# Patient Record
Sex: Female | Born: 1995 | Hispanic: Yes | Marital: Married | Smoking: Never smoker
Health system: Southern US, Community
[De-identification: ages and names within clinical notes are randomized; demographics above are authoritative.]

## PROBLEM LIST (undated history)

## (undated) HISTORY — PX: INDUCED ABORTION: SHX677

---
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
## 2015-07-07 ENCOUNTER — Encounter: Payer: Self-pay | Admitting: Emergency Medicine

## 2015-07-07 ENCOUNTER — Emergency Department
Admission: EM | Admit: 2015-07-07 | Discharge: 2015-07-07 | Disposition: A | Discharge status: Home / Self Care | Payer: Self-pay | Attending: Emergency Medicine | Admitting: Emergency Medicine

## 2015-07-07 ENCOUNTER — Emergency Department: Payer: Self-pay

## 2015-07-07 DIAGNOSIS — J36 Peritonsillar abscess: Secondary | ICD-10-CM

## 2015-07-07 DIAGNOSIS — J329 Chronic sinusitis, unspecified: Secondary | ICD-10-CM | POA: Insufficient documentation

## 2015-07-07 DIAGNOSIS — J039 Acute tonsillitis, unspecified: Secondary | ICD-10-CM

## 2015-07-07 DIAGNOSIS — Z3202 Encounter for pregnancy test, result negative: Secondary | ICD-10-CM | POA: Insufficient documentation

## 2015-07-07 LAB — CBC WITH DIFFERENTIAL/PLATELET
Basophils Absolute: 0.1 10*3/uL (ref 0–0.1)
Basophils Relative: 0 %
EOS ABS: 0.2 10*3/uL (ref 0–0.7)
EOS PCT: 1 %
HCT: 32.7 % — ABNORMAL LOW (ref 35.0–47.0)
HEMOGLOBIN: 10.4 g/dL — AB (ref 12.0–16.0)
Lymphocytes Relative: 11 %
Lymphs Abs: 1.9 10*3/uL (ref 1.0–3.6)
MCH: 26.5 pg (ref 26.0–34.0)
MCHC: 31.8 g/dL — ABNORMAL LOW (ref 32.0–36.0)
MCV: 83.4 fL (ref 80.0–100.0)
MONOS PCT: 9 %
Monocytes Absolute: 1.6 10*3/uL — ABNORMAL HIGH (ref 0.2–0.9)
NEUTROS ABS: 13.6 10*3/uL — AB (ref 1.4–6.5)
NEUTROS PCT: 79 %
PLATELETS: 391 10*3/uL (ref 150–440)
RBC: 3.92 MIL/uL (ref 3.80–5.20)
RDW: 15.9 % — ABNORMAL HIGH (ref 11.5–14.5)
WBC: 17.2 10*3/uL — ABNORMAL HIGH (ref 3.6–11.0)

## 2015-07-07 LAB — BASIC METABOLIC PANEL
Anion gap: 8 (ref 5–15)
BUN: 8 mg/dL (ref 6–20)
CHLORIDE: 102 mmol/L (ref 101–111)
CO2: 28 mmol/L (ref 22–32)
Calcium: 9 mg/dL (ref 8.9–10.3)
Creatinine, Ser: 0.65 mg/dL (ref 0.44–1.00)
GFR calc Af Amer: >60 mL/min (ref >60)
GFR calc non Af Amer: >60 mL/min (ref >60)
Glucose, Bld: 113 mg/dL — ABNORMAL HIGH (ref 65–99)
Potassium: 4 mmol/L (ref 3.5–5.1)
SODIUM: 138 mmol/L (ref 135–145)

## 2015-07-07 LAB — URINALYSIS COMPLETE WITH MICROSCOPIC (ARMC ONLY)
Bilirubin Urine: NEGATIVE
Glucose, UA: NEGATIVE mg/dL
Ketones, ur: NEGATIVE mg/dL
NITRITE: NEGATIVE
Protein, ur: 30 mg/dL — AB
Specific Gravity, Urine: 1.02 (ref 1.005–1.030)
pH: 5 (ref 5.0–8.0)

## 2015-07-07 LAB — POCT PREGNANCY, URINE: Preg Test, Ur: NEGATIVE

## 2015-07-07 MED ORDER — DEXAMETHASONE SODIUM PHOSPHATE 10 MG/ML IJ SOLN
10.0000 mg | Freq: Once | INTRAMUSCULAR | Status: AC
  Administered 2015-07-07: 10 mg via INTRAVENOUS
  Filled 2015-07-07: qty 1

## 2015-07-07 MED ORDER — IOHEXOL 300 MG/ML  SOLN
75.0000 mL | Freq: Once | INTRAMUSCULAR | Status: AC | PRN
  Administered 2015-07-07: 75 mL via INTRAVENOUS
  Filled 2015-07-07: qty 75

## 2015-07-07 MED ORDER — SODIUM CHLORIDE 0.9 % IV SOLN
3.0000 g | Freq: Once | INTRAVENOUS | Status: AC
  Administered 2015-07-07: 3 g via INTRAVENOUS
  Filled 2015-07-07: qty 3

## 2015-07-07 MED ORDER — AMOXICILLIN-POT CLAVULANATE 875-125 MG PO TABS: 1.0000 | ORAL_TABLET | Freq: Two times a day (BID) | ORAL | Status: AC

## 2015-07-07 MED ORDER — SODIUM CHLORIDE 0.9 % IV BOLUS (SEPSIS)
1000.0000 mL | Freq: Once | INTRAVENOUS | Status: AC
  Administered 2015-07-07: 1000 mL via INTRAVENOUS

## 2015-07-07 MED ORDER — HYDROCODONE-ACETAMINOPHEN 5-325 MG PO TABS: 1.0000 | ORAL_TABLET | ORAL | Status: AC | PRN

## 2015-07-07 NOTE — ED Notes (Signed)
Pt presents to ER alert and in NAD. Pt states sore throat for 2 weeks, pt states pain with swallowing.

## 2015-07-07 NOTE — ED Notes (Signed)
Pt alert and oriented X4, active, cooperative, pt in NAD. RR even and unlabored, color WNL.  Pt informed to return if any life threatening symptoms occur.   

## 2015-07-07 NOTE — Discharge Instructions (Signed)
RETURN TO ER IF ANY SEVERE WORSENING OF YOUR SYMPTOMS OR URGENT CONCERNS.  RETURN TO ER IF UNABLE TO TAKE MEDICATION, DIFFICULTY SWALLOWING  TAKE AUGMENTIN FOR INFECTION NORCO FOR PAIN AS NEEDED DRINK LOTS OF FLUIDS

## 2015-07-07 NOTE — ED Provider Notes (Signed)
[facility] Emergency Department Provider Note  ____________________________________________  Time seen: 7:22 AM  Chief Complaint Sore Throat   HPI Margaret Koch is a 18 y.o. female here with complaint of sore throat for 2 weeks. Patient states it hurts to swallow but she is able to swallow her own saliva and has been drinking fluids. She is taking over-the-counter medication for fever and pain with little relief.She has not seen anyone prior to this visit. She rates her pain is 7 out of 10 and is constant.   History reviewed. No pertinent past medical history.  There are no active problems to display for this patient.   Past Surgical History  Procedure Laterality Date  . Induced abortion      Current Outpatient Rx  Name  Route  Sig  Dispense  Refill  . amoxicillin-clavulanate (AUGMENTIN) 875-125 MG per tablet   Oral   Take 1 tablet by mouth 2 (two) times daily.   20 tablet   0   . HYDROcodone-acetaminophen (NORCO/VICODIN) 5-325 MG per tablet   Oral   Take 1 tablet by mouth every 4 (four) hours as needed for moderate pain.   20 tablet   0     Allergies Review of patient's allergies indicates no known allergies.  History reviewed. No pertinent family history.  Social History History  Substance Use Topics  . Smoking status: Never Smoker   . Smokeless tobacco: Not on file  . Alcohol Use: No    Review of Systems Constitutional: Positive fever/chills Eyes: No visual changes. ENT: Positive sore throat. Cardiovascular: Denies chest pain. Respiratory: Denies shortness of breath. Gastrointestinal: No abdominal pain.  No nausea, no vomiting.  No diarrhea.  No constipation. Genitourinary: Negative for dysuria. Musculoskeletal: Negative for back pain. Skin: Negative for rash. Neurological: Negative for headaches 10-point ROS otherwise negative.  ____________________________________________   PHYSICAL EXAM:  VITAL SIGNS: ED Triage  Vitals  Enc Vitals Group     BP 07/07/15 0304 126/75 mmHg     Pulse Rate 07/07/15 0304 103     Resp 07/07/15 0304 20     Temp 07/07/15 0304 99 F (37.2 C)     Temp Source 07/07/15 0304 Oral     SpO2 07/07/15 0304 100 %     Weight 07/07/15 0304 123 lb (55.792 kg)     Height 07/07/15 0304 5\' 3"  (1.6 m)     Head Cir --      Peak Flow --      Pain Score 07/07/15 0304 7     Pain Loc --      Pain Edu? --      Excl. in GC? --     Constitutional: Alert and oriented. Well appearing and in no acute distress. Eyes: Conjunctivae are normal. PERRL. EOMI. Head: Atraumatic. Nose: No congestion/rhinnorhea. Mouth/Throat: Mucous membranes are moist.  Oropharynx moderate erythema without exudate. There is some swelling on the right side without any uvula shifting. Patient is able to swallow her own saliva without any difficulty. Voice is muffled somewhat but patient is able to speak in complete sentences.  Neck: No stridor.  Supple Hematological/Lymphatic/Immunilogical: Positive bilateral cervical lymphadenopathy. Cardiovascular: Normal rate, regular rhythm. Grossly normal heart sounds.  Good peripheral circulation. Respiratory: Normal respiratory effort.  No retractions. Lungs CTAB. Gastrointestinal: Soft and nontender. No distention. Musculoskeletal: No lower extremity tenderness nor edema.  No joint effusions. Neurologic:  Normal speech and language. No gross focal neurologic deficits are appreciated. No gait instability. Skin:  Skin  is warm, dry and intact. No rash noted. Psychiatric: Mood and affect are normal. Speech and behavior are normal.  ____________________________________________   LABS (all labs ordered are listed, but only abnormal results are displayed)  Labs Reviewed  CBC WITH DIFFERENTIAL/PLATELET - Abnormal; Notable for the following:    WBC 17.2 (*)    Hemoglobin 10.4 (*)    HCT 32.7 (*)    MCHC 31.8 (*)    RDW 15.9 (*)    Neutro Abs 13.6 (*)    Monocytes Absolute 1.6  (*)    All other components within normal limits  BASIC METABOLIC PANEL - Abnormal; Notable for the following:    Glucose, Bld 113 (*)    All other components within normal limits  URINALYSIS COMPLETEWITH MICROSCOPIC ([facility] ONLY) - Abnormal; Notable for the following:    Color, Urine YELLOW (*)    APPearance CLEAR (*)    Hgb urine dipstick 1+ (*)    Protein, ur 30 (*)    Leukocytes, UA 3+ (*)    Bacteria, UA RARE (*)    Squamous Epithelial / LPF 0-5 (*)    All other components within normal limits  CULTURE, GROUP A STREP ([facility] ONLY)  POC URINE PREG, ED  POCT PREGNANCY, URINE   _RADIOLOGY   CT per radiologist shows tonsillitis with a peritonsillar abscess on the right ____________________________________________   PROCEDURES  Procedure(s) performed: None  Critical Care performed: No  ____________________________________________   INITIAL IMPRESSION / ASSESSMENT AND PLAN / ED COURSE  Pertinent labs & imaging results that were available during my care of the patient were reviewed by me and considered in my medical decision making (see chart for details).  Patient was started on Unasyn 3 g IV in the emergency room and given Decadron IV. She was able to continue drinking fluids in the emergency room and at one point requested some food. She is to continue iron Augmentin for 10 days and prednisone for the next 3 days. She is to follow-up with Dr. Elenore Rota on Monday if not any improvement. She was told to return to the emergency room if any severe worsening of her symptoms such as high fever, inability to swallow her medication or swallow her saliva. ____________________________________________   FINAL CLINICAL IMPRESSION(S) / ED DIAGNOSES  Final diagnoses:  Acute tonsillitis  Peritonsillar abscess      Tommi Rumps, PA-C 07/07/15 1446  Arnaldo Natal, MD 07/07/15 [phone]

## 2015-07-07 NOTE — ED Notes (Addendum)
Pt strep test negative, PA Bjorn Loser) notified

## 2015-07-09 LAB — CULTURE, GROUP A STREP (THRC)

## 2015-10-04 ENCOUNTER — Encounter: Payer: Self-pay | Admitting: Emergency Medicine

## 2015-10-04 ENCOUNTER — Emergency Department
Admission: EM | Admit: 2015-10-04 | Discharge: 2015-10-04 | Disposition: A | Discharge status: Home / Self Care | Attending: Emergency Medicine | Admitting: Emergency Medicine

## 2015-10-04 ENCOUNTER — Encounter: Payer: Self-pay | Admitting: Medical Oncology

## 2015-10-04 ENCOUNTER — Emergency Department
Admission: EM | Admit: 2015-10-04 | Discharge: 2015-10-05 | Disposition: A | Discharge status: Home / Self Care | Source: Home / Self Care | Attending: Emergency Medicine | Admitting: Emergency Medicine

## 2015-10-04 DIAGNOSIS — O9A211 Injury, poisoning and certain other consequences of external causes complicating pregnancy, first trimester: Secondary | ICD-10-CM

## 2015-10-04 DIAGNOSIS — Z3491 Encounter for supervision of normal pregnancy, unspecified, first trimester: Secondary | ICD-10-CM

## 2015-10-04 DIAGNOSIS — Y9389 Activity, other specified: Secondary | ICD-10-CM | POA: Insufficient documentation

## 2015-10-04 DIAGNOSIS — Y9289 Other specified places as the place of occurrence of the external cause: Secondary | ICD-10-CM

## 2015-10-04 DIAGNOSIS — Z3A10 10 weeks gestation of pregnancy: Secondary | ICD-10-CM

## 2015-10-04 DIAGNOSIS — Y990 Civilian activity done for income or pay: Secondary | ICD-10-CM

## 2015-10-04 DIAGNOSIS — W108XXA Fall (on) (from) other stairs and steps, initial encounter: Secondary | ICD-10-CM | POA: Insufficient documentation

## 2015-10-04 DIAGNOSIS — S3993XA Unspecified injury of pelvis, initial encounter: Secondary | ICD-10-CM | POA: Insufficient documentation

## 2015-10-04 DIAGNOSIS — W19XXXA Unspecified fall, initial encounter: Secondary | ICD-10-CM

## 2015-10-04 DIAGNOSIS — Y998 Other external cause status: Secondary | ICD-10-CM | POA: Insufficient documentation

## 2015-10-04 DIAGNOSIS — S3991XA Unspecified injury of abdomen, initial encounter: Secondary | ICD-10-CM | POA: Insufficient documentation

## 2015-10-04 DIAGNOSIS — O418X10 Other specified disorders of amniotic fluid and membranes, first trimester, not applicable or unspecified: Secondary | ICD-10-CM

## 2015-10-04 DIAGNOSIS — O468X1 Other antepartum hemorrhage, first trimester: Principal | ICD-10-CM

## 2015-10-04 NOTE — ED Notes (Signed)
Pt presents to ED with c/o vaginal bleeding; approx [redacted] weeks pregnant. Pt states she was seen in this ED earlier today for lower abd cramping after she fell down a few stairs while at work. Ultrasound and blood work were normal per pt. Pt states her cramping has continued and approx 20 minutes ago she noticed that she was bleeding slightly and returned for further evaluation. Unsure if she is passing clots. No other complications during this pregnancy. Followed by [facility].

## 2015-10-04 NOTE — Discharge Instructions (Signed)
Please seek medical attention for any high fevers, chest pain, shortness of breath, change in behavior, persistent vomiting, bloody stool or any other new or concerning symptoms.   First Trimester of Pregnancy The first trimester of pregnancy is from week 1 until the end of week 12 (months 1 through 3). A week after a sperm fertilizes an egg, the egg will implant on the wall of the uterus. This embryo will begin to develop into a baby. Genes from you and your partner are forming the baby. The female genes determine whether the baby is a boy or a girl. At 6-8 weeks, the eyes and face are formed, and the heartbeat can be seen on ultrasound. At the end of 12 weeks, all the baby's organs are formed.  Now that you are pregnant, you will want to do everything you can to have a healthy baby. Two of the most important things are to get good prenatal care and to follow your health care provider's instructions. Prenatal care is all the medical care you receive before the baby's birth. This care will help prevent, find, and treat any problems during the pregnancy and childbirth. BODY CHANGES Your body goes through many changes during pregnancy. The changes vary from woman to woman.   You may gain or lose a couple of pounds at first.  You may feel sick to your stomach (nauseous) and throw up (vomit). If the vomiting is uncontrollable, call your health care provider.  You may tire easily.  You may develop headaches that can be relieved by medicines approved by your health care provider.  You may urinate more often. Painful urination may mean you have a bladder infection.  You may develop heartburn as a result of your pregnancy.  You may develop constipation because certain hormones are causing the muscles that push waste through your intestines to slow down.  You may develop hemorrhoids or swollen, bulging veins (varicose veins).  Your breasts may begin to grow larger and become tender. Your nipples may  stick out more, and the tissue that surrounds them (areola) may become darker.  Your gums may bleed and may be sensitive to brushing and flossing.  Dark spots or blotches (chloasma, mask of pregnancy) may develop on your face. This will likely fade after the baby is born.  Your menstrual periods will stop.  You may have a loss of appetite.  You may develop cravings for certain kinds of food.  You may have changes in your emotions from day to day, such as being excited to be pregnant or being concerned that something may go wrong with the pregnancy and baby.  You may have more vivid and strange dreams.  You may have changes in your hair. These can include thickening of your hair, rapid growth, and changes in texture. Some women also have hair loss during or after pregnancy, or hair that feels dry or thin. Your hair will most likely return to normal after your baby is born. WHAT TO EXPECT AT YOUR PRENATAL VISITS During a routine prenatal visit:  You will be weighed to make sure you and the baby are growing normally.  Your blood pressure will be taken.  Your abdomen will be measured to track your baby's growth.  The fetal heartbeat will be listened to starting around week 10 or 12 of your pregnancy.  Test results from any previous visits will be discussed. Your health care provider may ask you:  How you are feeling.  If you are feeling  the baby move. °· If you have had any abnormal symptoms, such as leaking fluid, bleeding, severe headaches, or abdominal cramping. °· If you are using any tobacco products, including cigarettes, chewing tobacco, and electronic cigarettes. °· If you have any questions. °Other tests that may be performed during your first trimester include: °· Blood tests to find your blood type and to check for the presence of any previous infections. They will also be used to check for low iron levels (anemia) and Rh antibodies. Later in the pregnancy, blood tests for  diabetes will be done along with other tests if problems develop. °· Urine tests to check for infections, diabetes, or protein in the urine. °· An ultrasound to confirm the proper growth and development of the baby. °· An amniocentesis to check for possible genetic problems. °· Fetal screens for spina bifida and Down syndrome. °· You may need other tests to make sure you and the baby are doing well. °· HIV (human immunodeficiency virus) testing. Routine prenatal testing includes screening for HIV, unless you choose not to have this test. °HOME CARE INSTRUCTIONS  °Medicines °· Follow your health care provider's instructions regarding medicine use. Specific medicines may be either safe or unsafe to take during pregnancy. °· Take your prenatal vitamins as directed. °· If you develop constipation, try taking a stool softener if your health care provider approves. °Diet °· Eat regular, well-balanced meals. Choose a variety of foods, such as meat or vegetable-based protein, fish, milk and low-fat dairy products, vegetables, fruits, and whole grain breads and cereals. Your health care provider will help you determine the amount of weight gain that is right for you. °· Avoid raw meat and uncooked cheese. These carry germs that can cause birth defects in the baby. °· Eating four or five small meals rather than three large meals a day may help relieve nausea and vomiting. If you start to feel nauseous, eating a few soda crackers can be helpful. Drinking liquids between meals instead of during meals also seems to help nausea and vomiting. °· If you develop constipation, eat more high-fiber foods, such as fresh vegetables or fruit and whole grains. Drink enough fluids to keep your urine clear or pale yellow. °Activity and Exercise °· Exercise only as directed by your health care provider. Exercising will help you: °¨ Control your weight. °¨ Stay in shape. °¨ Be prepared for labor and delivery. °· Experiencing pain or cramping  in the lower abdomen or low back is a good sign that you should stop exercising. Check with your health care provider before continuing normal exercises. °· Try to avoid standing for long periods of time. Move your legs often if you must stand in one place for a long time. °· Avoid heavy lifting. °· Wear low-heeled shoes, and practice good posture. °· You may continue to have sex unless your health care provider directs you otherwise. °Relief of Pain or Discomfort °· Wear a good support bra for breast tenderness.   °· Take warm sitz baths to soothe any pain or discomfort caused by hemorrhoids. Use hemorrhoid cream if your health care provider approves.   °· Rest with your legs elevated if you have leg cramps or low back pain. °· If you develop varicose veins in your legs, wear support hose. Elevate your feet for 15 minutes, 3-4 times a day. Limit salt in your diet. °Prenatal Care °· Schedule your prenatal visits by the twelfth week of pregnancy. They are usually scheduled monthly at first, then more   often in the last 2 months before delivery.  Write down your questions. Take them to your prenatal visits.  Keep all your prenatal visits as directed by your health care provider. Safety  Wear your seat belt at all times when driving.  Make a list of emergency phone numbers, including numbers for family, friends, the hospital, and police and fire departments. General Tips  Ask your health care provider for a referral to a local prenatal education class. Begin classes no later than at the beginning of month 6 of your pregnancy.  Ask for help if you have counseling or nutritional needs during pregnancy. Your health care provider can offer advice or refer you to specialists for help with various needs.  Do not use hot tubs, steam rooms, or saunas.  Do not douche or use tampons or scented sanitary pads.  Do not cross your legs for long periods of time.  Avoid cat litter boxes and soil used by cats.  These carry germs that can cause birth defects in the baby and possibly loss of the fetus by miscarriage or stillbirth.  Avoid all smoking, herbs, alcohol, and medicines not prescribed by your health care provider. Chemicals in these affect the formation and growth of the baby.  Do not use any tobacco products, including cigarettes, chewing tobacco, and electronic cigarettes. If you need help quitting, ask your health care provider. You may receive counseling support and other resources to help you quit.  Schedule a dentist appointment. At home, brush your teeth with a soft toothbrush and be gentle when you floss. SEEK MEDICAL CARE IF:   You have dizziness.  You have mild pelvic cramps, pelvic pressure, or nagging pain in the abdominal area.  You have persistent nausea, vomiting, or diarrhea.  You have a bad smelling vaginal discharge.  You have pain with urination.  You notice increased swelling in your face, hands, legs, or ankles. SEEK IMMEDIATE MEDICAL CARE IF:   You have a fever.  You are leaking fluid from your vagina.  You have spotting or bleeding from your vagina.  You have severe abdominal cramping or pain.  You have rapid weight gain or loss.  You vomit blood or material that looks like coffee grounds.  You are exposed to [place] measles and have never had them.  You are exposed to fifth disease or chickenpox.  You develop a severe headache.  You have shortness of breath.  You have any kind of trauma, such as from a fall or a car accident.   This information is not intended to replace advice given to you by your health care provider. Make sure you discuss any questions you have with your health care provider.   Document Released: 11/25/2001 Document Revised: 12/22/2014 Document Reviewed: 10/11/2013 Elsevier Interactive Patient Education [employer]2016 [employer] Inc.

## 2015-10-04 NOTE — ED Provider Notes (Signed)
[facility] Emergency Department Provider Note    ____________________________________________  Time seen: 1725  I have reviewed the triage vital signs and the nursing notes.   HISTORY  Chief Complaint Fall and Abdominal Pain   History limited by: Not Limited   HPI Margaret Koch is a 19 y.o. female who presents to the emergency department at roughly [redacted] weeks pregnant after a fall. She states she fell down about 5 or 6 stairs. She landed on her left side however then rolled over to her right side. She has had some pelvic cramping since then. It has been fairly constant since the injury. She denies any vaginal bleeding or abnormal vaginal discharge. She denies any injury to her head or neck.   No past medical history on file.  There are no active problems to display for this patient.   Past Surgical History  Procedure Laterality Date  . Induced abortion      Current Outpatient Rx  Name  Route  Sig  Dispense  Refill  . HYDROcodone-acetaminophen (NORCO/VICODIN) 5-325 MG per tablet   Oral   Take 1 tablet by mouth every 4 (four) hours as needed for moderate pain.   20 tablet   0     Allergies Review of patient's allergies indicates no known allergies.  No family history on file.  Social History Social History  Substance Use Topics  . Smoking status: Never Smoker   . Smokeless tobacco: Not on file  . Alcohol Use: No    Review of Systems  Constitutional: Negative for fever. Cardiovascular: Negative for chest pain. Respiratory: Negative for shortness of breath. Gastrointestinal: Abdominal cramping Genitourinary: Negative for dysuria. Musculoskeletal: Negative for back pain. Skin: Negative for rash. Neurological: Negative for headaches, focal weakness or numbness.   10-point ROS otherwise negative.  ____________________________________________   PHYSICAL EXAM:  VITAL SIGNS: ED Triage Vitals  Enc Vitals Group     BP  10/04/15 1707 142/74 mmHg     Pulse Rate 10/04/15 1707 115     Resp 10/04/15 1707 18     Temp 10/04/15 1707 98.7 F (37.1 C)     Temp Source 10/04/15 1707 Oral     SpO2 10/04/15 1707 99 %     Weight 10/04/15 1707 123 lb (55.792 kg)     Height 10/04/15 1707  (1.6 m)     Head Cir --      Peak Flow --      Pain Score 10/04/15 1707 0   Constitutional: Alert and oriented. Well appearing and in no distress. Eyes: Conjunctivae are normal. PERRL. Normal extraocular movements. ENT   Head: Normocephalic and atraumatic.   Nose: No congestion/rhinnorhea.   Mouth/Throat: Mucous membranes are moist.   Neck: No stridor. Hematological/Lymphatic/Immunilogical: No cervical lymphadenopathy. Cardiovascular: Normal rate, regular rhythm.  No murmurs, rubs, or gallops. Respiratory: Normal respiratory effort without tachypnea nor retractions. Breath sounds are clear and equal bilaterally. No wheezes/rales/rhonchi. Gastrointestinal: Soft and minimally tender to palpation. Genitourinary: Deferred Musculoskeletal: Normal range of motion in all extremities. No joint effusions.  No lower extremity tenderness nor edema. Neurologic:  Normal speech and language. No gross focal neurologic deficits are appreciated. Speech is normal.  Skin:  Skin is warm, dry and intact. No rash noted. Psychiatric: Mood and affect are normal. Speech and behavior are normal. Patient exhibits appropriate insight and judgment.  ____________________________________________    LABS (pertinent positives/negatives)  None  ____________________________________________   EKG  None  ____________________________________________    RADIOLOGY  None   ____________________________________________   PROCEDURES  Procedure(s) performed: None  Critical Care performed: No  ____________________________________________   INITIAL IMPRESSION / ASSESSMENT AND PLAN / ED COURSE  Pertinent labs & imaging results  that were available during my care of the patient were reviewed by me and considered in my medical decision making (see chart for details).  Patient presents to the emergency department with concerns for her pregnancy after a fall. Bedside ultrasound shows an intrauterine pregnancy. Fetal heart rate was measured at 176. Good fetal movement. Large amount of amniotic fluid. Patient without any vaginal bleeding. Minimal tenderness to palpation. No concerning fetal findings at this point. I did explain to the patient the importance of following up with OB/GYN since he can get a more complete ultrasound. Discussed return precautions.  ____________________________________________   FINAL CLINICAL IMPRESSION(S) / ED DIAGNOSES  Final diagnoses:  Fall, initial encounter  First trimester pregnancy     Phineas SemenGraydon Kameron Glazebrook, MD 10/04/15 [phone]

## 2015-10-04 NOTE — ED Notes (Signed)
Pt ambulatory to triage with reports that she is approx 10w preg, has not had ultrasound to confirm but reports that she fell on her stomach today and since then has been having some abd cramping. Denies vaginal bleeding.

## 2015-10-04 NOTE — ED Notes (Signed)
MD at bedside with ultrasound machine

## 2015-10-05 ENCOUNTER — Emergency Department

## 2015-10-05 LAB — CBC
HEMATOCRIT: 32.1 % — AB (ref 35.0–47.0)
HEMOGLOBIN: 10.4 g/dL — AB (ref 12.0–16.0)
MCH: 25.4 pg — AB (ref 26.0–34.0)
MCHC: 32.5 g/dL (ref 32.0–36.0)
MCV: 78.1 fL — ABNORMAL LOW (ref 80.0–100.0)
Platelets: 287 10*3/uL (ref 150–440)
RBC: 4.11 MIL/uL (ref 3.80–5.20)
RDW: 19.2 % — ABNORMAL HIGH (ref 11.5–14.5)
WBC: 10 10*3/uL (ref 3.6–11.0)

## 2015-10-05 LAB — ABO/RH: ABO/RH(D): O POS

## 2015-10-05 LAB — HCG, QUANTITATIVE, PREGNANCY: HCG, BETA CHAIN, QUANT, S: 83008 m[IU]/mL — AB (ref <5)

## 2015-10-05 NOTE — ED Provider Notes (Signed)
[facility] Emergency Department Provider Note  ____________________________________________  Time seen: 11:50 PM  I have reviewed the triage vital signs and the nursing notes.   HISTORY  Chief Complaint Vaginal Bleeding      HPI Margaret Koch is a 19 y.o. female G2P01 elective abortion earlier this year. Presents with history of accidentally falling down approximately 6 or 7 steps while at work today. Patient denies any head injury no loss of consciousness. Patient however does admit to bilateral flank pain worse on the left. Patient was seen in the emergency department earlier today bedside ultrasound was performed by Dr. Derrill Kay who stated no gross abnormality. Patient now returns with heavy vaginal bleeding noted approximately one hour before presentation. Patient also admits to pelvic cramping current pain score 7 out of 10. Patient does not know her blood type.   Past medical history None There are no active problems to display for this patient.   Past Surgical History  Procedure Laterality Date  . Induced abortion      Current Outpatient Rx  Name  Route  Sig  Dispense  Refill  . HYDROcodone-acetaminophen (NORCO/VICODIN) 5-325 MG per tablet   Oral   Take 1 tablet by mouth every 4 (four) hours as needed for moderate pain.   20 tablet   0     Allergies No known drug allergies No family history on file.  Social History Social History  Substance Use Topics  . Smoking status: Never Smoker   . Smokeless tobacco: None  . Alcohol Use: No    Review of Systems  Constitutional: Negative for fever. Eyes: Negative for visual changes. ENT: Negative for sore throat. Cardiovascular: Negative for chest pain. Respiratory: Negative for shortness of breath. Gastrointestinal: Positive for abdominal pain, Positive for pelvic pain Genitourinary: Negative for dysuria. Musculoskeletal: Negative for back pain. Skin: Negative for  rash. Neurological: Negative for headaches, focal weakness or numbness.   10-point ROS otherwise negative.  ____________________________________________   PHYSICAL EXAM:  VITAL SIGNS: ED Triage Vitals  Enc Vitals Group     BP 10/04/15 2349 138/82 mmHg     Pulse Rate 10/04/15 2349 78     Resp 10/04/15 2349 18     Temp 10/04/15 2349 98.3 F (36.8 C)     Temp Source 10/04/15 2349 Oral     SpO2 10/04/15 2349 98 %     Weight 10/04/15 2349 123 lb (55.792 kg)     Height 10/04/15 2349  (1.6 m)     Head Cir --      Peak Flow --      Pain Score 10/04/15 2350 0     Pain Loc --      Pain Edu? --      Excl. in GC? --     Constitutional: Alert and oriented. Well appearing and in no distress. Eyes: Conjunctivae are normal. PERRL. Normal extraocular movements. ENT   Head: Normocephalic and atraumatic.   Nose: No congestion/rhinnorhea.   Mouth/Throat: Mucous membranes are moist.   Neck: No stridor. Hematological/Lymphatic/Immunilogical: No cervical lymphadenopathy. Cardiovascular: Normal rate, regular rhythm. Normal and symmetric distal pulses are present in all extremities. No murmurs, rubs, or gallops. Respiratory: Normal respiratory effort without tachypnea nor retractions. Breath sounds are clear and equal bilaterally. No wheezes/rales/rhonchi. Gastrointestinal: Left upper quadrant pain with palpation.. No distention. There is no CVA tenderness. Genitourinary: deferred Musculoskeletal: Nontender with normal range of motion in all extremities. No joint effusions.  No lower extremity tenderness nor edema.  Neurologic:  Normal speech and language. No gross focal neurologic deficits are appreciated. Speech is normal.  Skin:  Skin is warm, dry and intact. No rash noted. Psychiatric: Mood and affect are normal. Speech and behavior are normal. Patient exhibits appropriate insight and judgment.  ____________________________________________    LABS (pertinent  positives/negatives)  Labs Reviewed  HCG, QUANTITATIVE, PREGNANCY - Abnormal; Notable for the following:    hCG, Beta Chain, Quant, S 83008 (*)    All other components within normal limits  CBC - Abnormal; Notable for the following:    Hemoglobin 10.4 (*)    HCT 32.1 (*)    MCV 78.1 (*)    MCH 25.4 (*)    RDW 19.2 (*)    All other components within normal limits  ABO/RH     RADIOLOGY   US Abdomen Limited (Final result) Result time: 10/05/15 01:22:21   Final result by Rad Results In Interface (10/05/15 01:22:21)   Narrative:   CLINICAL DATA: Status post fall, with left upper quadrant abdominal tenderness. Evaluate spleen. Initial encounter.  EXAM: LIMITED ABDOMINAL ULTRASOUND  COMPARISON: None.  FINDINGS: The spleen is unremarkable in appearance, measuring 7.4 cm in length. There is no evidence of splenic injury.  IMPRESSION: Spleen unremarkable in appearance.   Electronically Signed By: Roanna Raider M.D. On: 10/05/2015 01:22          US Ob Transvaginal (Final result) Result time: 10/05/15 01:06:27   Final result by Rad Results In Interface (10/05/15 01:06:27)   Narrative:   CLINICAL DATA: Acute onset of pelvic pain and vaginal bleeding, status post fall.  EXAM: OBSTETRIC <14 WK [place] AND TRANSVAGINAL OB US  TECHNIQUE: Both transabdominal and transvaginal ultrasound examinations were performed for complete evaluation of the gestation as well as the maternal uterus, adnexal regions, and pelvic cul-de-sac. Transvaginal technique was performed to assess early pregnancy.  COMPARISON: None.  FINDINGS: Intrauterine gestational sac: Visualized/normal in shape.  Yolk sac: Yes  Embryo: Yes  Cardiac Activity: Yes  Heart Rate: 168 bpm  CRL: 3.22 cm  10 w  1 d         [place] EDC: 05/01/2016  Maternal uterus/adnexae: A small amount of subchorionic hemorrhage is noted. The uterus is otherwise unremarkable in appearance.  The  right ovary is unremarkable in appearance, measuring 3.0 x 1.4 x 2.0 cm. The left ovary is not visualized on this study. No suspicious adnexal masses are seen. There is no evidence for ovarian torsion.  No free fluid is seen within the pelvic cul-de-sac.  IMPRESSION: 1. Single live intrauterine pregnancy noted, with a crown-rump length of 3.2 cm, corresponding to a gestational age of [redacted] weeks 1 day. This matches the gestational age of [redacted] weeks 3 days by LMP, reflecting an estimated date of delivery of May 06, 2016. 2. Small amount of subchorionic hemorrhage noted.   Electronically Signed By: Roanna Raider M.D. On: 10/05/2015 01:06          US OB Comp Less 14 Wks (Final result) Result time: 10/05/15 01:06:27   Final result by Rad Results In Interface (10/05/15 01:06:27)   Narrative:   CLINICAL DATA: Acute onset of pelvic pain and vaginal bleeding, status post fall.  EXAM: OBSTETRIC <14 WK [place] AND TRANSVAGINAL OB US  TECHNIQUE: Both transabdominal and transvaginal ultrasound examinations were performed for complete evaluation of the gestation as well as the maternal uterus, adnexal regions, and pelvic cul-de-sac. Transvaginal technique was performed to assess early pregnancy.  COMPARISON: None.  FINDINGS: Intrauterine gestational  sac: Visualized/normal in shape.  Yolk sac: Yes  Embryo: Yes  Cardiac Activity: Yes  Heart Rate: 168 bpm  CRL: 3.22 cm  10 w  1 d         [place] EDC: 05/01/2016  Maternal uterus/adnexae: A small amount of subchorionic hemorrhage is noted. The uterus is otherwise unremarkable in appearance.  The right ovary is unremarkable in appearance, measuring 3.0 x 1.4 x 2.0 cm. The left ovary is not visualized on this study. No suspicious adnexal masses are seen. There is no evidence for ovarian torsion.  No free fluid is seen within the pelvic cul-de-sac.  IMPRESSION: 1. Single live intrauterine pregnancy noted, with a  crown-rump length of 3.2 cm, corresponding to a gestational age of [redacted] weeks 1 day. This matches the gestational age of [redacted] weeks 3 days by LMP, reflecting an estimated date of delivery of May 06, 2016. 2. Small amount of subchorionic hemorrhage noted.   Electronically Signed By: Roanna RaiderJeffery Chang M.D. On: 10/05/2015 01:06       INITIAL IMPRESSION / ASSESSMENT AND PLAN / ED COURSE  Pertinent labs & imaging results that were available during my care of the patient were reviewed by me and considered in my medical decision making (see chart for details).  History of physical exam consistent with traumatically induced subchorionic hematoma. Patient's blood type is O+ I advised the patient and her husband at length regarding need for follow-up.  ____________________________________________   FINAL CLINICAL IMPRESSION(S) / ED DIAGNOSES  Final diagnoses:  Subchorionic hemorrhage, first trimester      Darci Currentandolph N Brown, MD 10/05/15 [phone]

## 2015-10-05 NOTE — Discharge Instructions (Signed)
Subchorionic Hematoma °A subchorionic hematoma is a gathering of blood between the outer wall of the placenta and the inner wall of the womb (uterus). The placenta is the organ that connects the fetus to the wall of the uterus. The placenta performs the feeding, breathing (oxygen to the fetus), and waste removal (excretory work) of the fetus.  °Subchorionic hematoma is the most common abnormality found on a result from ultrasonography done during the first trimester or early second trimester of pregnancy. If there has been little or no vaginal bleeding, early small hematomas usually shrink on their own and do not affect your baby or pregnancy. The blood is gradually absorbed over 1-2 weeks. When bleeding starts later in pregnancy or the hematoma is larger or occurs in an older pregnant woman, the outcome may not be as good. Larger hematomas may get bigger, which increases the chances for miscarriage. Subchorionic hematoma also increases the risk of premature detachment of the placenta from the uterus, preterm (premature) labor, and stillbirth. °HOME CARE INSTRUCTIONS °· Stay on bed rest if your health care provider recommends this. Although bed rest will not prevent more bleeding or prevent a miscarriage, your health care provider may recommend bed rest until you are advised otherwise. °· Avoid heavy lifting (more than 10 lb [4.5 kg]), exercise, sexual intercourse, or douching as directed by your health care provider. °· Keep track of the number of pads you use each day and how soaked (saturated) they are. Write down this information. °· Do not use tampons. °· Keep all follow-up appointments as directed by your health care provider. Your health care provider may ask you to have follow-up blood tests or ultrasound tests or both. °SEEK IMMEDIATE MEDICAL CARE IF: °· You have severe cramps in your stomach, back, abdomen, or pelvis. °· You have a fever. °· You pass large clots or tissue. Save any tissue for your health  care provider to look at. °· Your bleeding increases or you become lightheaded, feel weak, or have fainting episodes. °  °This information is not intended to replace advice given to you by your health care provider. Make sure you discuss any questions you have with your health care provider. °  °Document Released: 03/18/2007 Document Revised: 12/22/2014 Document Reviewed: 06/30/2013 °Elsevier Interactive Patient Education ©2016 Elsevier Inc. ° °

## 2015-10-06 ENCOUNTER — Emergency Department
Admission: EM | Admit: 2015-10-06 | Discharge: 2015-10-06 | Disposition: A | Discharge status: Home / Self Care | Source: Home / Self Care | Attending: Emergency Medicine | Admitting: Emergency Medicine

## 2015-10-06 ENCOUNTER — Emergency Department

## 2015-10-06 DIAGNOSIS — O418X10 Other specified disorders of amniotic fluid and membranes, first trimester, not applicable or unspecified: Secondary | ICD-10-CM | POA: Insufficient documentation

## 2015-10-06 DIAGNOSIS — O468X1 Other antepartum hemorrhage, first trimester: Secondary | ICD-10-CM

## 2015-10-06 DIAGNOSIS — O209 Hemorrhage in early pregnancy, unspecified: Secondary | ICD-10-CM

## 2015-10-06 DIAGNOSIS — Z3A11 11 weeks gestation of pregnancy: Secondary | ICD-10-CM

## 2015-10-06 LAB — HCG, QUANTITATIVE, PREGNANCY: hCG, Beta Chain, Quant, S: 91493 m[IU]/mL — ABNORMAL HIGH (ref <5)

## 2015-10-06 NOTE — ED Notes (Signed)
Reports seen in ED earlier in the week after a fall and again had vaginal bleeding.  Patient reports being [redacted] week pregnant and that bleeding has gotten worse.

## 2015-10-06 NOTE — Discharge Instructions (Signed)
Your ultrasound this evening showed a clearing up some of the blood that had built up from the subchorionic hemorrhage.  Do not have sex or place anything in the vagina until you are [facility] as you have planned. Return to the emergency department if you have worsening pain heavier bleeding or other urgent concerns.  Vaginal Bleeding During Pregnancy, First Trimester A small amount of bleeding (spotting) from the vagina is relatively common in early pregnancy. It usually stops on its own. Various things may cause bleeding or spotting in early pregnancy. Some bleeding may be related to the pregnancy, and some may not. In most cases, the bleeding is normal and is not a problem. However, bleeding can also be a sign of something serious. Be sure to tell your health care provider about any vaginal bleeding right away. Some possible causes of vaginal bleeding during the first trimester include:  Infection or inflammation of the cervix.  Growths (polyps) on the cervix.  Miscarriage or threatened miscarriage.  Pregnancy tissue has developed outside of the uterus and in a fallopian tube (tubal pregnancy).  Tiny cysts have developed in the uterus instead of pregnancy tissue (molar pregnancy). HOME CARE INSTRUCTIONS  Watch your condition for any changes. The following actions may help to lessen any discomfort you are feeling:  Follow your health care provider's instructions for limiting your activity. If your health care provider orders bed rest, you may need to stay in bed and only get up to use the bathroom. However, your health care provider may allow you to continue light activity.  If needed, make plans for someone to help with your regular activities and responsibilities while you are on bed rest.  Keep track of the number of pads you use each day, how often you change pads, and how soaked (saturated) they are. Write this down.  Do not use tampons. Do not douche.  Do not have  sexual intercourse or orgasms until approved by your health care provider.  If you pass any tissue from your vagina, save the tissue so you can show it to your health care provider.  Only take over-the-counter or prescription medicines as directed by your health care provider.  Do not take aspirin because it can make you bleed.  Keep all follow-up appointments as directed by your health care provider. SEEK MEDICAL CARE IF:  You have any vaginal bleeding during any part of your pregnancy.  You have cramps or labor pains.  You have a fever, not controlled by medicine. SEEK IMMEDIATE MEDICAL CARE IF:   You have severe cramps in your back or belly (abdomen).  You pass large clots or tissue from your vagina.  Your bleeding increases.  You feel light-headed or weak, or you have fainting episodes.  You have chills.  You are leaking fluid or have a gush of fluid from your vagina.  You pass out while having a bowel movement. MAKE SURE YOU:  Understand these instructions.  Will watch your condition.  Will get help right away if you are not doing well or get worse.   This information is not intended to replace advice given to you by your health care provider. Make sure you discuss any questions you have with your health care provider.   Document Released: 09/10/2005 Document Revised: 12/06/2013 Document Reviewed: 08/08/2013 Elsevier Interactive Patient Education [employer]2016 Elsevier Inc.

## 2015-10-06 NOTE — ED Provider Notes (Signed)
[facility] Emergency Department Provider Note  ____________________________________________  Time seen: 2120  I have reviewed the triage vital signs and the nursing notes.   HISTORY  Chief Complaint Vaginal Bleeding     HPI Margaret Koch is a 19 y.o. female who presents the emergency department due to vaginal bleeding. She was recently diagnosed pregnant. She had some vaginal bleeding this past Thursday and was seen in the emergency department. Her ultrasound showed that she was approximately [redacted] weeks along. That evening, she had intercourse with her significant other. Following this she had further vaginal bleeding which worsened on Friday and continued today. She reports that she simply had light spotting on Thursday, but yesterday and today it is been more like her menstrual period. She is having some cramping.    No past medical history on file.  There are no active problems to display for this patient.   Past Surgical History  Procedure Laterality Date  . Induced abortion      Current Outpatient Rx  Name  Route  Sig  Dispense  Refill  . HYDROcodone-acetaminophen (NORCO/VICODIN) 5-325 MG per tablet   Oral   Take 1 tablet by mouth every 4 (four) hours as needed for moderate pain.   20 tablet   0     Allergies Review of patient's allergies indicates no known allergies.  No family history on file.  Social History Social History  Substance Use Topics  . Smoking status: Never Smoker   . Smokeless tobacco: Not on file  . Alcohol Use: No    Review of Systems Constitutional: Negative for fever. ENT: Negative for sore throat. Cardiovascular: Negative for chest pain. Respiratory: Negative for cough. Gastrointestinal: Negative for abdominal pain, vomiting and diarrhea. Genitourinary: Vaginal bleeding and pelvic cramping. Patient is [redacted] weeks pregnant. See history of present illness. Musculoskeletal: No myalgias or injuries. Skin:  Negative for rash. Neurological: Negative for paresthesia or weakness   10-point ROS otherwise negative.  ____________________________________________   PHYSICAL EXAM:  VITAL SIGNS: ED Triage Vitals  Enc Vitals Group     BP 10/06/15 1921 137/78 mmHg     Pulse Rate 10/06/15 1921 75     Resp 10/06/15 1921 18     Temp 10/06/15 1921 98.2 F (36.8 C)     Temp Source 10/06/15 1921 Oral     SpO2 10/06/15 1921 100 %     Weight 10/06/15 1921 123 lb (55.792 kg)     Height 10/06/15 1921  (1.6 m)     Head Cir --      Peak Flow --      Pain Score 10/06/15 1921 0     Pain Loc --      Pain Edu? --      Excl. in GC? --     Constitutional: Alert and oriented. Well appearing and in no distress. ENT   Head: Normocephalic and atraumatic.   Nose: No congestion/rhinnorhea.    Cardiovascular: Normal rate, regular rhythm, no murmur noted Respiratory:  Normal respiratory effort, no tachypnea.    Breath sounds are clear and equal bilaterally.  Gastrointestinal: Soft. No distention.  Some cramping with mild discomfort. Back: No muscle spasm, no tenderness, no CVA tenderness. Musculoskeletal: No deformity noted. Nontender with normal range of motion in all extremities.  No noted edema. Neurologic:  Normal speech and language. No gross focal neurologic deficits are appreciated.  Skin:  Skin is warm, dry. No rash noted. Psychiatric: Mood and affect are normal. Speech  and behavior are normal.  ____________________________________________    LABS (pertinent positives/negatives)  Labs Reviewed  HCG, QUANTITATIVE, PREGNANCY - Abnormal; Notable for the following:    hCG, Beta Chain, Quant, S [id number] (*)    All other components within normal limits    RADIOLOGY  OB ultrasound: IMPRESSION: 1. Single live intrauterine pregnancy noted, with a crown-rump length of 3.9 cm, corresponding to a gestational age of [redacted] weeks 5 days. The prior ultrasound matched the gestational age of [redacted] weeks  4 days by LMP, reflecting an estimated date of delivery of May 06, 2016. 2. Small amount of subchorionic hemorrhage noted. The previously noted small amount of subchorionic hemorrhage near the cervical canal is no longer present, likely corresponding to the vaginal bleeding.  ____________________________________________   PROCEDURES    ____________________________________________   INITIAL IMPRESSION / ASSESSMENT AND PLAN / ED COURSE  Pertinent labs & imaging results that were available during my care of the patient were reviewed by me and considered in my medical decision making (see chart for details).   Well-appearing 19 year old female. She reports worse sitting of her vaginal bleeding over the past 2 days. She does have some pelvic cramping. She had an ultrasound that showed a subchorionic hemorrhage on Thursday evening. We will do another ultrasound to assess to see if this area is expanding and to reassess the pregnancy.  ----------------------------------------- 11:22 PM on 10/06/2015 -----------------------------------------  Ultrasound shows an IUP at 10 weeks 5 days. There is a small amount of subchorionic hemorrhage noted. Some blood that had previously noted in the cervix has cleared.  We'll discharge the patient to follow with GYN. We have reiterating the advice that she is now aware of-no intercourse, nothing in vagina, until further evaluation by Oncology.  ____________________________________________   FINAL CLINICAL IMPRESSION(S) / ED DIAGNOSES  Final diagnoses:  Vaginal bleeding in pregnancy, first trimester  Subchorionic hemorrhage in first trimester      Darien Ramusavid W Feliz [place], MD 10/06/15 2330

## 2015-10-06 NOTE — ED Notes (Signed)
Patient returned from ultrasound. Husband at bedside. Call bell in reach.

## 2015-10-24 ENCOUNTER — Ambulatory Visit
Admission: EM | Admit: 2015-10-24 | Discharge: 2015-10-24 | Disposition: A | Discharge status: Home / Self Care | Attending: Family Medicine | Admitting: Family Medicine

## 2015-10-24 ENCOUNTER — Encounter: Payer: Self-pay | Admitting: Emergency Medicine

## 2015-10-24 DIAGNOSIS — J028 Acute pharyngitis due to other specified organisms: Principal | ICD-10-CM

## 2015-10-24 DIAGNOSIS — J029 Acute pharyngitis, unspecified: Secondary | ICD-10-CM

## 2015-10-24 DIAGNOSIS — B9789 Other viral agents as the cause of diseases classified elsewhere: Principal | ICD-10-CM

## 2015-10-24 LAB — RAPID STREP SCREEN (MED CTR MEBANE ONLY): Streptococcus, Group A Screen (Direct): NEGATIVE

## 2015-10-24 NOTE — ED Notes (Signed)
Patient sore throat for 3 days.

## 2015-10-24 NOTE — ED Provider Notes (Signed)
CSN: [id number]     Arrival date & time 10/24/15  0825 History   First MD Initiated Contact with Patient 10/24/15 [phone]     Chief Complaint  Patient presents with  . Sore Throat   (Consider location/radiation/quality/duration/timing/severity/associated sxs/prior Treatment) HPI   This a 19 year old female who is [redacted] weeks pregnant resents for possible strep throat. Dates that she's had a sore throat for 3 days. She does not have any fever but she has had some chills and coldness. She is use some Benadryl for some nausea that she's had which has helped her. Also had a friend gave her some promethazine which has helped her sleep in addition to helping with her nausea. She has no cough has had some nasal congestion but basically has a sore throat that is difficult to swallow.  History reviewed. No pertinent past medical history. Past Surgical History  Procedure Laterality Date  . Induced abortion     Family History  Problem Relation Age of Onset  . Family history unknown: Yes   Social History  Substance Use Topics  . Smoking status: Never Smoker   . Smokeless tobacco: None  . Alcohol Use: No   OB History    Gravida Para Term Preterm AB TAB SAB Ectopic Multiple Living   1              Review of Systems  Constitutional: Positive for fever, chills and activity change. Negative for diaphoresis and fatigue.  HENT: Positive for congestion, postnasal drip, sore throat and trouble swallowing.   All other systems reviewed and are negative.   Allergies  Review of patient's allergies indicates no known allergies.  Home Medications   Prior to Admission medications   Medication Sig Start Date End Date Taking? Authorizing Provider  HYDROcodone-acetaminophen (NORCO/VICODIN) 5-325 MG per tablet Take 1 tablet by mouth every 4 (four) hours as needed for moderate pain. 07/07/15   Tommi Rumpshonda L Summers, PA-C   Meds Ordered and Administered this Visit  Medications - No data to display  BP 97/67 mmHg   Pulse 81  Temp(Src) 96.5 F (35.8 C) (Tympanic)  Resp 17  Ht 5\' 3"  (1.6 m)  Wt 128 lb (58.06 kg)  BMI 22.68 kg/m2  SpO2 98%  LMP 07/29/2015 No data found.   Physical Exam  Constitutional: She is oriented to person, place, and time. She appears well-developed and well-nourished. No distress.  HENT:  Head: Normocephalic and atraumatic.  Right Ear: External ear normal.  Left Ear: External ear normal.  Nose: Nose normal.  Mouth/Throat: Oropharynx is clear and moist. No oropharyngeal exudate.  Eyes: Pupils are equal, round, and reactive to light.  Neck: Neck supple.  Pulmonary/Chest: Breath sounds normal. No stridor. No respiratory distress. She has no wheezes. She has no rales.  Musculoskeletal: Normal range of motion. She exhibits no edema or tenderness.  Lymphadenopathy:    She has no cervical adenopathy.  Neurological: She is alert and oriented to person, place, and time.  Skin: Skin is warm and dry. She is not diaphoretic.  Psychiatric: She has a normal mood and affect. Her behavior is normal. Judgment and thought content normal.  Nursing note and vitals reviewed.   ED Course  Procedures (including critical care time)  Labs Review Labs Reviewed  RAPID STREP SCREEN (NOT [facility])  CULTURE, GROUP A STREP ([facility] ONLY)    Imaging Review No results found.   Visual Acuity Review  Right Eye Distance:   Left Eye Distance:   Bilateral  Distance:    Right Eye Near:   Left Eye Near:    Bilateral Near:         MDM   1. Acute viral pharyngitis    Discussion with the patient regarding our findings in view of her pregnancy. Tore that is most likely a viral pharyngitis and will need to run its course. Elected to prescribe any medications for her because of her pregnancy. She states that she has been using some Benadryl which according to properties is no problem in pregnancy. She states a friend of hers gave her some promethazine syrup that she would like a prescription  for this but I have declined and deferred that to her OB/GYN which she has an appointment on December 2. I've told her to attack the OB/GYN who may prescribe that over the phone and decide to move her appointment up. Will call in 48 hours for the culture and sensitivities from the throat swab. Today that she did not want any further information regarding her pharyngitis treated symptomatically with the saltwater gargles and possible Rhinocort over-the-counter.    Lutricia Feil, PA-C 10/24/15 [phone]

## 2015-10-26 LAB — CULTURE, GROUP A STREP (THRC)

## 2015-10-26 NOTE — ED Notes (Signed)
Final report of strep testing negative  

## 2015-11-07 ENCOUNTER — Emergency Department
Admission: EM | Admit: 2015-11-07 | Discharge: 2015-11-07 | Disposition: A | Discharge status: Home / Self Care | Attending: Emergency Medicine | Admitting: Emergency Medicine

## 2015-11-07 ENCOUNTER — Encounter: Payer: Self-pay | Admitting: Urgent Care

## 2015-11-07 DIAGNOSIS — L509 Urticaria, unspecified: Secondary | ICD-10-CM | POA: Diagnosis not present

## 2015-11-07 DIAGNOSIS — Z3A13 13 weeks gestation of pregnancy: Secondary | ICD-10-CM | POA: Insufficient documentation

## 2015-11-07 DIAGNOSIS — O99711 Diseases of the skin and subcutaneous tissue complicating pregnancy, first trimester: Secondary | ICD-10-CM | POA: Diagnosis not present

## 2015-11-07 MED ORDER — FAMOTIDINE IN NACL 20-0.9 MG/50ML-% IV SOLN
20.0000 mg | Freq: Once | INTRAVENOUS | Status: AC
  Administered 2015-11-07: 20 mg via INTRAVENOUS
  Filled 2015-11-07: qty 50

## 2015-11-07 MED ORDER — DIPHENHYDRAMINE HCL 50 MG/ML IJ SOLN
50.0000 mg | Freq: Once | INTRAMUSCULAR | Status: AC
  Administered 2015-11-07: 50 mg via INTRAVENOUS
  Filled 2015-11-07: qty 1

## 2015-11-07 MED ORDER — EPINEPHRINE 0.3 MG/0.3ML IJ SOAJ: 0.3000 mg | Freq: Once | INTRAMUSCULAR | Status: AC

## 2015-11-07 NOTE — ED Notes (Addendum)
Patient presents with hives to torso and perineum. Patient reports that symptoms started at 2000 after eating Lo Mein that contained mushroom. No diagnosed allergy to mushrooms, but "this happens every time that I eat them." Patient is currently [redacted] weeks pregnant per her report.

## 2015-11-07 NOTE — ED Notes (Signed)
Pt complains of itchy, rash around abdominal and lower extremities. 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 Pt denies any sob or pain at this time.

## 2015-11-07 NOTE — Discharge Instructions (Signed)
Hives Hives are itchy, red, swollen areas of the skin. They can vary in size and location on your body. Hives can come and go for hours or several days (acute hives) or for several weeks (chronic hives). Hives do not spread from person to person (noncontagious). They may get worse with scratching, exercise, and emotional stress. CAUSES   Allergic reaction to food, additives, or drugs.  Infections, including the common cold.  Illness, such as vasculitis, lupus, or thyroid disease.  Exposure to sunlight, heat, or cold.  Exercise.  Stress.  Contact with chemicals. SYMPTOMS   Red or white swollen patches on the skin. The patches may change size, shape, and location quickly and repeatedly.  Itching.  Swelling of the hands, feet, and face. This may occur if hives develop deeper in the skin. DIAGNOSIS  Your caregiver can usually tell what is wrong by performing a physical exam. Skin or blood tests may also be done to determine the cause of your hives. In some cases, the cause cannot be determined. TREATMENT  Mild cases usually get better with medicines such as antihistamines. Severe cases may require an emergency epinephrine injection. If the cause of your hives is known, treatment includes avoiding that trigger.  HOME CARE INSTRUCTIONS   Avoid causes that trigger your hives.  Take antihistamines as directed by your caregiver to reduce the severity of your hives. Non-sedating or low-sedating antihistamines are usually recommended. Do not drive while taking an antihistamine.  Take any other medicines prescribed for itching as directed by your caregiver.  Wear loose-fitting clothing.  Keep all follow-up appointments as directed by your caregiver. SEEK MEDICAL CARE IF:   You have persistent or severe itching that is not relieved with medicine.  You have painful or swollen joints. SEEK IMMEDIATE MEDICAL CARE IF:   You have a fever.  Your tongue or lips are swollen.  You have  trouble breathing or swallowing.  You feel tightness in the throat or chest.  You have abdominal pain. These problems may be the first sign of a life-threatening allergic reaction. Call your local emergency services (911 in [place] MAKE SURE YOU:   Understand these instructions.  Will watch your condition.  Will get help right away if you are not doing well or get worse.   This information is not intended to replace advice given to you by your health care provider. Make sure you discuss any questions you have with your health care provider.   Document Released: 12/01/2005 Document Revised: 12/06/2013 Document Reviewed: 02/24/2012 Elsevier Interactive Patient Education 2016 [employer]  

## 2015-11-07 NOTE — ED Provider Notes (Signed)
[facility] Emergency Department Provider Note  ____________________________________________  Time seen: 12:30 AM  I have reviewed the triage vital signs and the nursing notes.   HISTORY  Chief Complaint Urticaria      HPI Anne HahnHannah Koch is a 19 y.o. female proximal my [redacted] weeks pregnant presents with hives to her torso and lower extremities. 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 Patient states that she ate Lo Mein before onset of symptoms. Patient denies any dyspnea no difficulty swallowing.      Past medical history No pertinent past medical history There are no active problems to display for this patient.   Past Surgical History  Procedure Laterality Date  . Induced abortion      Current Outpatient Rx  Name  Route  Sig  Dispense  Refill  . HYDROcodone-acetaminophen (NORCO/VICODIN) 5-325 MG per tablet   Oral   Take 1 tablet by mouth every 4 (four) hours as needed for moderate pain.   20 tablet   0     Allergies Review of patient's allergies indicates no known allergies.  Family History  Problem Relation Age of Onset  . Family history unknown: Yes    Social History Social History  Substance Use Topics  . Smoking status: Never Smoker   . Smokeless tobacco: None  . Alcohol Use: No    Review of Systems  Constitutional: Negative for fever. Eyes: Negative for visual changes. ENT: Negative for sore throat. Cardiovascular: Negative for chest pain. Respiratory: Negative for shortness of breath. Gastrointestinal: Negative for abdominal pain, vomiting and diarrhea. Genitourinary: Negative for dysuria. Musculoskeletal: Negative for back pain. Skin: Positive for rash. Neurological: Negative for headaches, focal weakness or numbness.   10-point ROS  otherwise negative.  ____________________________________________   PHYSICAL EXAM:  VITAL SIGNS: ED Triage Vitals  Enc Vitals Group     BP 11/07/15 0005 118/80 mmHg     Pulse Rate 11/07/15 0005 81     Resp 11/07/15 0005 16     Temp 11/07/15 0005 97.8 F (36.6 C)     Temp Source 11/07/15 0005 Oral     SpO2 11/07/15 0005 99 %     Weight 11/07/15 0005 128 lb (58.06 kg)     Height 11/07/15 0005 5\' 3"  (1.6 m)     Head Cir --      Peak Flow --      Pain Score 11/07/15 0048 0     Pain Loc --      Pain Edu? --      Excl. in GC? --      Constitutional: Alert and oriented. Well appearing and in no distress. Eyes: Conjunctivae are normal. PERRL. Normal extraocular movements. ENT   Head: Normocephalic and atraumatic.   Nose: No congestion/rhinnorhea.   Mouth/Throat: Mucous membranes are moist.   Neck: No stridor. Hematological/Lymphatic/Immunilogical: No cervical lymphadenopathy. Cardiovascular: Normal rate, regular rhythm. Normal and symmetric distal pulses are present in all extremities. No murmurs, rubs, or gallops. Respiratory: Normal respiratory effort without tachypnea nor retractions. Breath sounds are clear and equal bilaterally. No wheezes/rales/rhonchi. Gastrointestinal: Soft and nontender. No distention. There is no CVA tenderness. Genitourinary: deferred Musculoskeletal: Nontender with normal range of motion in all extremities. No joint effusions.  No lower extremity tenderness nor edema. Neurologic:  Normal speech and language.  No gross focal neurologic deficits are appreciated. Speech is normal.  Skin:  Urticarial rash noted torso and bilateral lower extremity.    INITIAL IMPRESSION / ASSESSMENT AND PLAN / ED COURSE  Pertinent labs & imaging results that were available during my care of the patient were reviewed by me and considered in my medical decision making (see chart for details).  History and physical exam consistent with allergic reaction  patient received Benadryl and Pepcid with complete resolution of the rash patient will be referred to Dr. Elenore Rota  ____________________________________________   FINAL CLINICAL IMPRESSION(S) / ED DIAGNOSES  Final diagnoses:  Hives      Darci Current, MD 11/07/15 [phone]

## 2015-11-07 NOTE — ED Notes (Signed)
MD at bedside. 

## 2015-11-25 ENCOUNTER — Ambulatory Visit: Admission: EM | Admit: 2015-11-25 | Discharge: 2015-11-25 | Disposition: A | Discharge status: Home / Self Care

## 2015-11-25 NOTE — ED Notes (Addendum)
Patient complains of abdominal pain and she states that she has not had a Bowel Movement in over 4 days. [place] because she feels like she needs to throw up but, she cannot. She states that [place] also [place] a medication to turn her urine blue, but states that she does not want to have blue urine. Patient is currently pregnant.

## 2016-01-31 IMAGING — CT CT NECK W/ CM
2 of 3 series · 8 of 14 positions shown, 9 images · IV contrast (omnipaque)
Comparison: None.

CLINICAL DATA: Sore throat for 2 weeks. Pain with swallowing. The
patient has been unable a E scratch the patient has been unable to
eat for 2 days.

EXAM:
CT NECK WITH CONTRAST
TECHNIQUE: Multidetector CT imaging of the neck was performed using the
standard protocol following the bolus administration of intravenous
contrast.
CONTRAST:  75 mL OMNIPAQUE IOHEXOL 300 MG/ML  SOLN

[Series 2: axial neck · axial · 0.48mm/px · z∈[-227,-93]mm · 4 of 113 slices shown]
[im 23/113  bone]
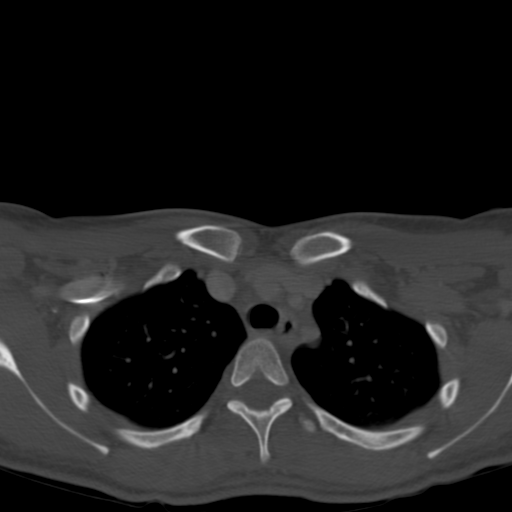
[im 45/113  bone]
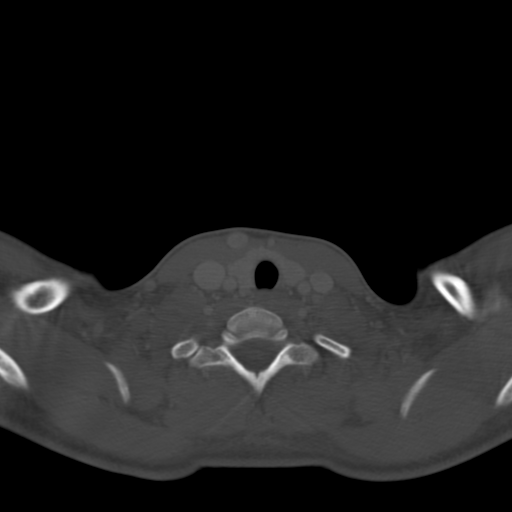
[im 68/113  bone]
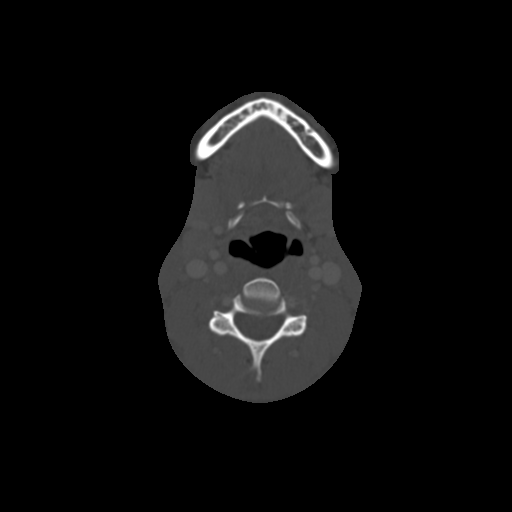
[im 90/113  bone]
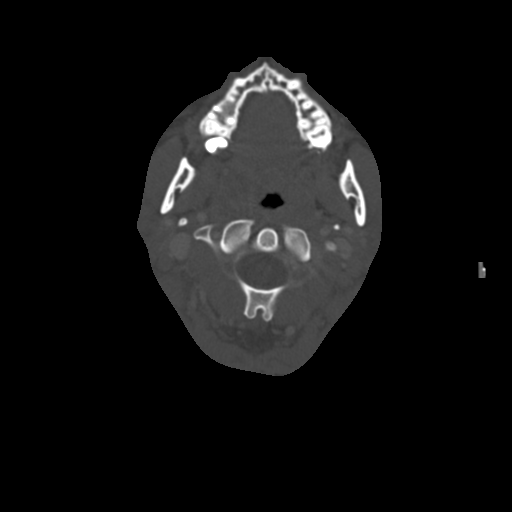

[Series 7: ax oropharynx · axial · 0.38mm/px · z∈[-253,-120]mm · 4 of 117 slices shown, 5 images]
[im 24/117  soft-tissue]
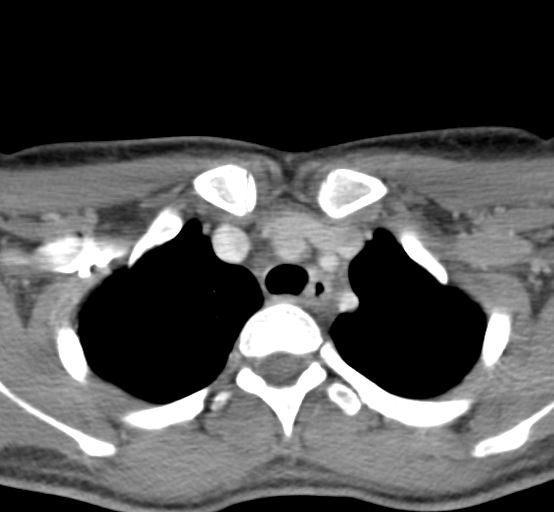
[im 24/117  bone]
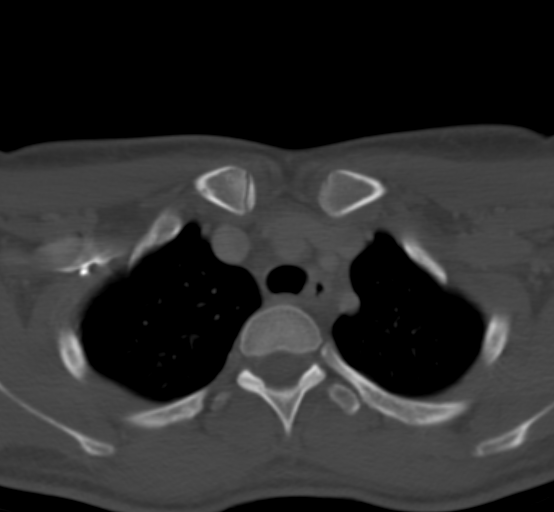
[im 47/117  bone]
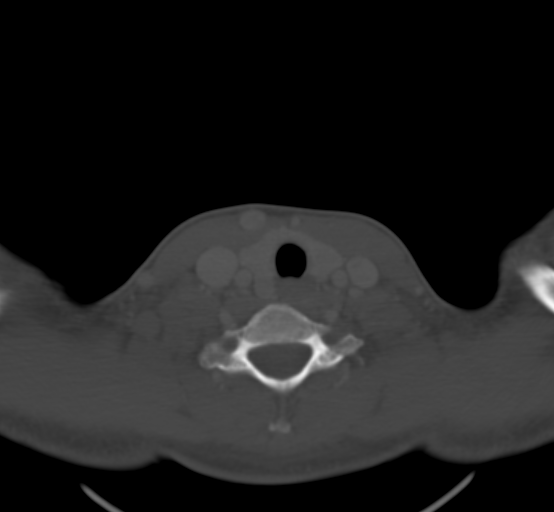
[im 70/117  bone]
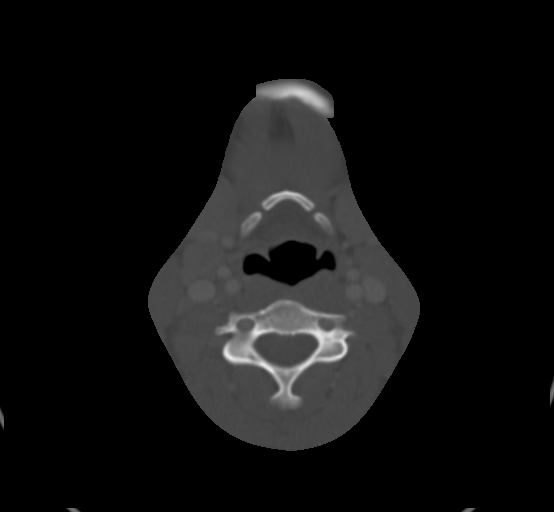
[im 93/117  bone]
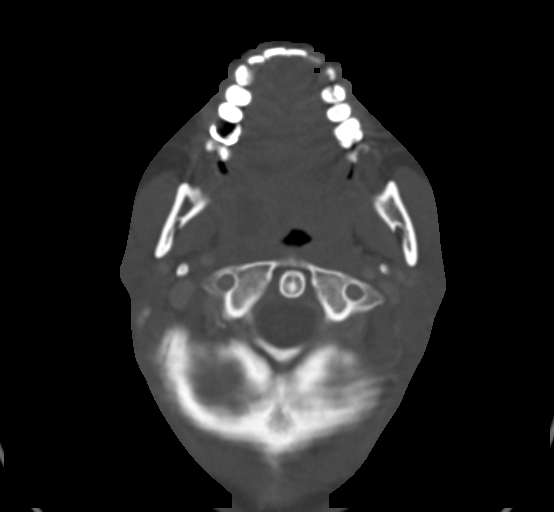

[8 of 14 positions shown; findings below may reference images not displayed]

FINDINGS: Pharynx and larynx: The tonsils are markedly swollen. A bilobed
fluid collection in the right palatine tonsil measures 2.4 cm
craniocaudal x 1.1 cm AP x 0.8 cm transverse. No other abscess is
identified.

Salivary glands: Unremarkable.

Thyroid: Unremarkable.

Lymph nodes: Scattered lymph nodes are identified bilaterally. Level
2 lymph nodes measuring 1.4 cm are seen on the right and left on
image 39. Lymphadenopathy is most consistent with reactive change.

Vascular: Unremarkable.

Limited intracranial: Unremarkable.

Visualized orbits: Unremarkable.

Mastoids and visualized paranasal sinuses: A short air-fluid level
is seen in the left maxillary sinus.

Skeleton: The neck is in mild flexion. No focal bony abnormality is
identified.

Upper chest: Clear.
IMPRESSION: Tonsillitis with a peritonsillar abscess on the right as described
above. Associated reactive lymphadenopathy is noted.

## 2016-04-30 IMAGING — US US ABDOMEN LIMITED
1 series · 11 of 11 positions shown · non-contrast
Comparison: None.

CLINICAL DATA: Status post fall, with left upper quadrant abdominal
tenderness. Evaluate spleen. Initial encounter.

EXAM:
LIMITED ABDOMINAL ULTRASOUND

[Series 1: us abdomen limited · 0.20mm/px · 11 of 11 slices shown]
[im 1/11]
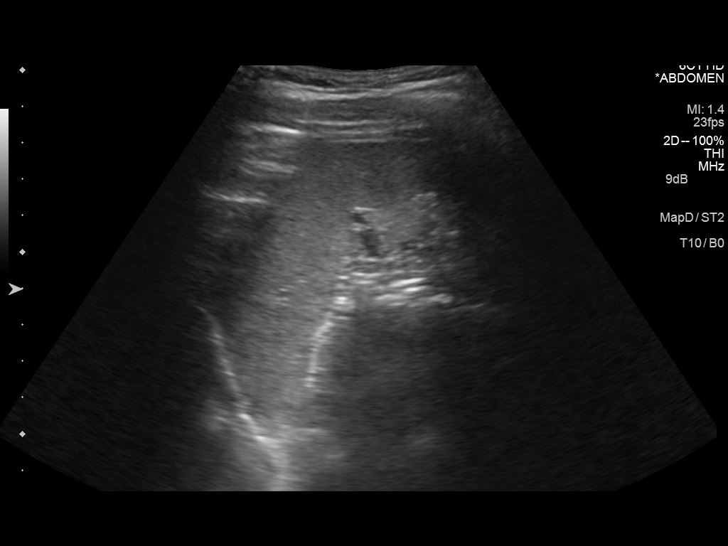
[im 2/11]
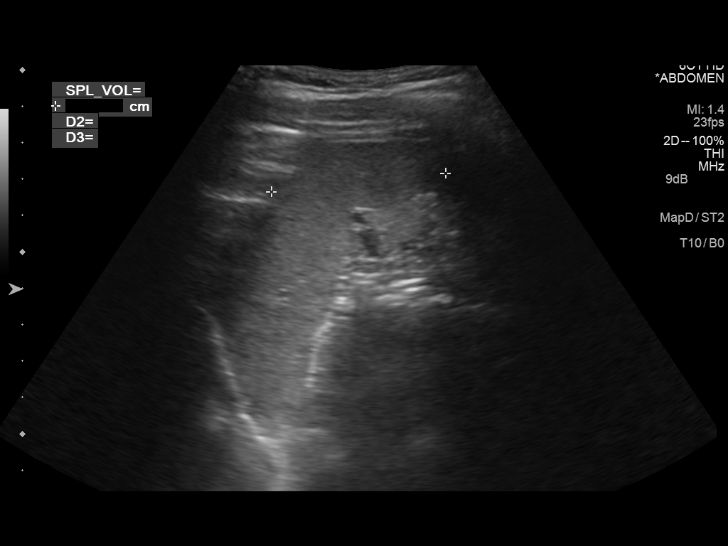
[im 3/11]
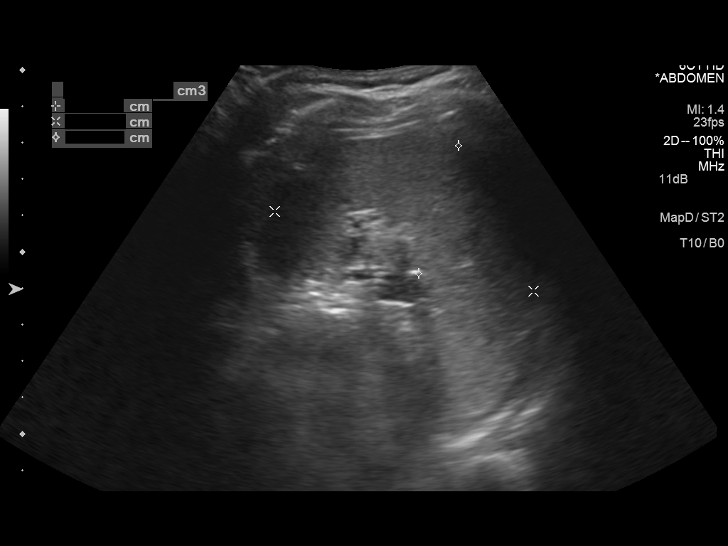
[im 4/11]
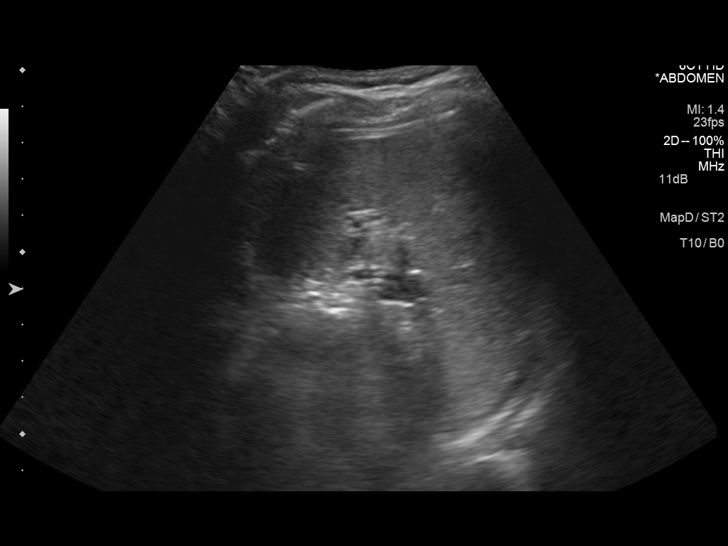
[im 5/11]
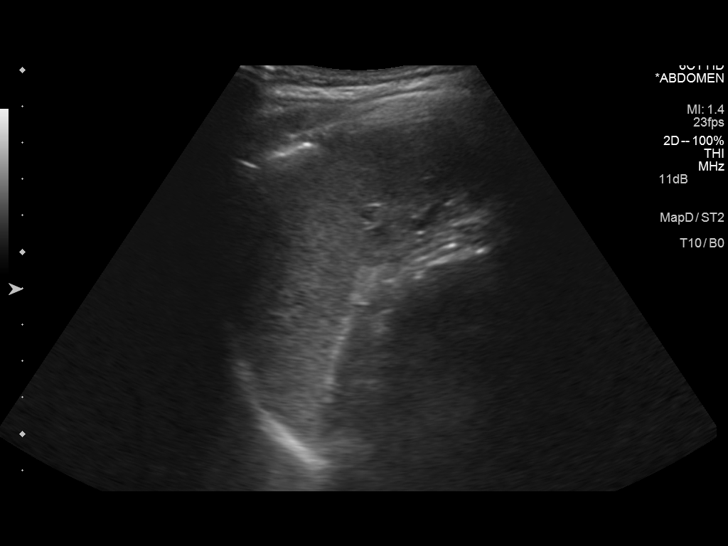
[im 6/11]
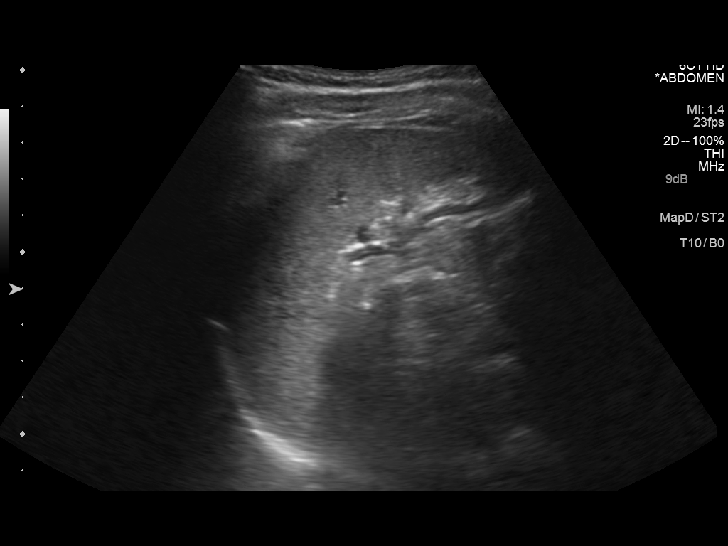
[im 7/11]
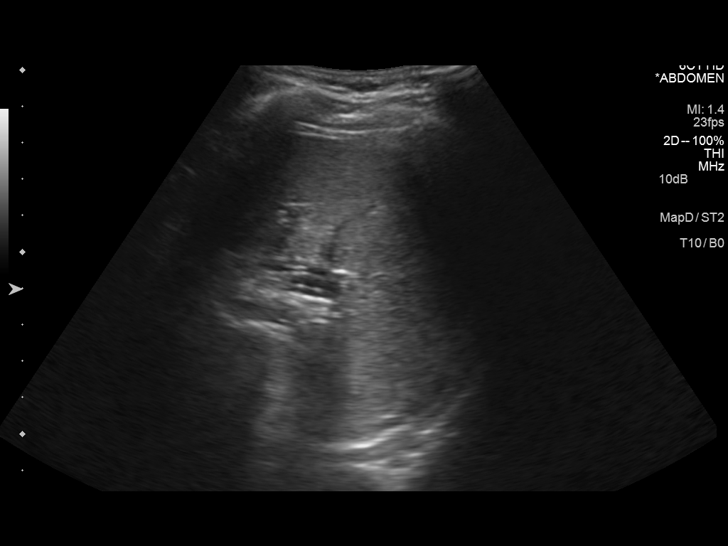
[im 8/11]
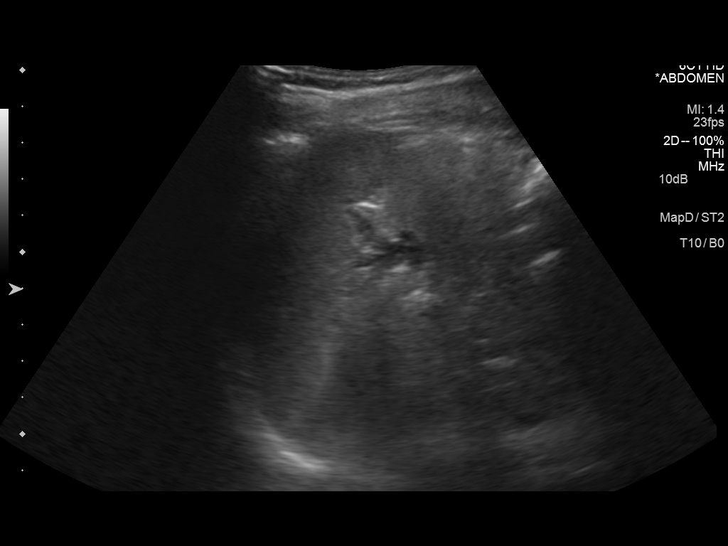
[im 9/11]
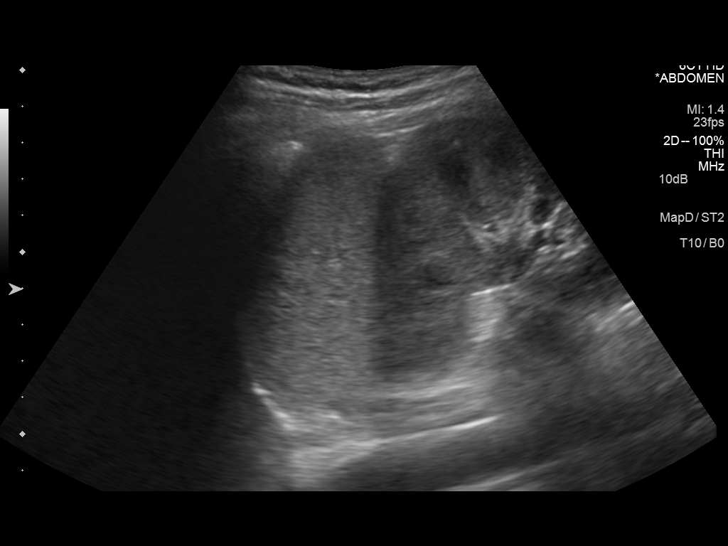
[im 10/11]
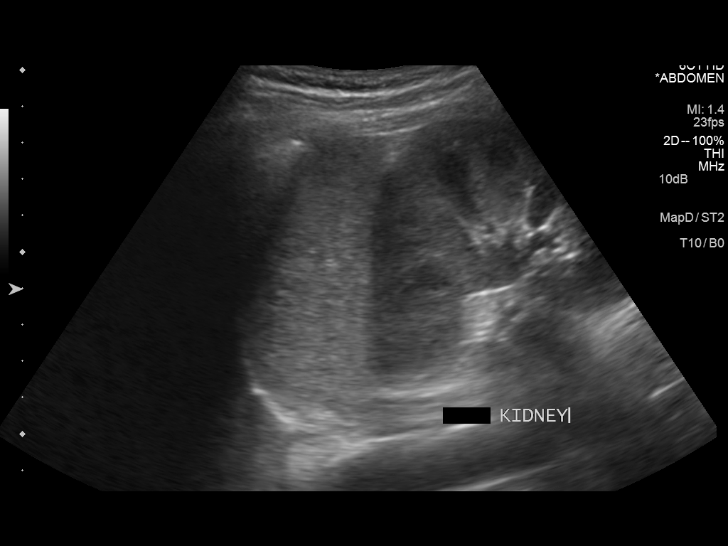
[im 11/11]
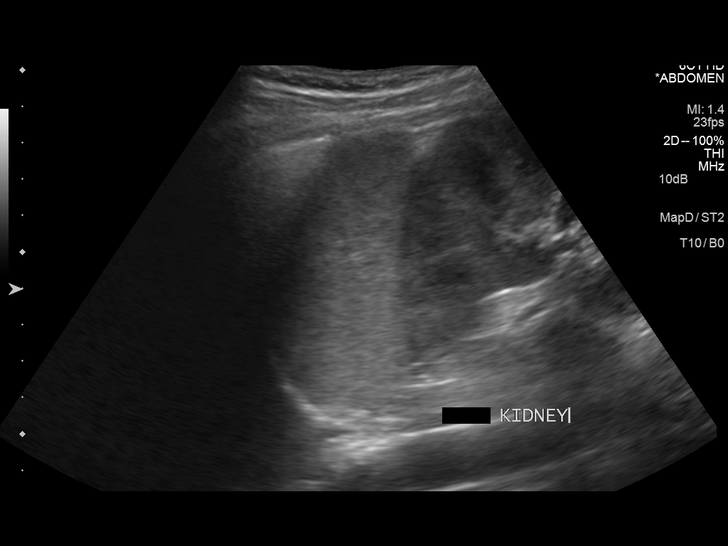

[11 of 11 positions shown; findings below may reference images not displayed]

FINDINGS: The spleen is unremarkable in appearance, measuring 7.4 cm in
length. There is no evidence of splenic injury.
IMPRESSION: Spleen unremarkable in appearance.

## 2017-03-19 ENCOUNTER — Emergency Department
Admission: EM | Admit: 2017-03-19 | Discharge: 2017-03-19 | Disposition: A | Discharge status: Home / Self Care | Attending: Emergency Medicine | Admitting: Emergency Medicine

## 2017-03-19 ENCOUNTER — Encounter: Payer: Self-pay | Admitting: Emergency Medicine

## 2017-03-19 DIAGNOSIS — O26891 Other specified pregnancy related conditions, first trimester: Secondary | ICD-10-CM | POA: Insufficient documentation

## 2017-03-19 DIAGNOSIS — T7840XA Allergy, unspecified, initial encounter: Secondary | ICD-10-CM | POA: Diagnosis not present

## 2017-03-19 DIAGNOSIS — Z5321 Procedure and treatment not carried out due to patient leaving prior to being seen by health care provider: Secondary | ICD-10-CM | POA: Insufficient documentation

## 2017-03-19 DIAGNOSIS — Z3A12 12 weeks gestation of pregnancy: Secondary | ICD-10-CM | POA: Insufficient documentation

## 2017-03-19 MED ORDER — DIPHENHYDRAMINE HCL 25 MG PO CAPS
ORAL_CAPSULE | ORAL | Status: DC
Start: 2017-03-19 — End: 2017-03-20
  Filled 2017-03-19: qty 2

## 2017-03-19 MED ORDER — DIPHENHYDRAMINE HCL 25 MG PO CAPS
50.0000 mg | ORAL_CAPSULE | Freq: Once | ORAL | Status: AC
  Administered 2017-03-19: 50 mg via ORAL

## 2017-03-19 NOTE — ED Triage Notes (Signed)
Pt ambulatory to triage in NAD, report allergic reaction to mushrooms, report SOB and hives to back. No hives seen at this time, respirations equal and unlabored, o2sat 100%, breaths sounds clear.

## 2017-03-19 NOTE — ED Notes (Signed)
Pt report sx improved after benadryl, no SOB noted, redness noted to throat but no obvious swelling, pt denies any itching or hives.  Pt ambulatory out of lobby with friend, states she will return if sx worsen.
